# Patient Record
Sex: Male | Born: 2000 | Race: Black or African American | Hispanic: No | Marital: Single | Smoking: Current some day smoker
Health system: Southern US, Community
[De-identification: ages and names within clinical notes are randomized; demographics above are authoritative.]

## PROBLEM LIST (undated history)

## (undated) ENCOUNTER — Ambulatory Visit: Payer: Self-pay | Source: Home / Self Care

## (undated) DIAGNOSIS — F909 Attention-deficit hyperactivity disorder, unspecified type: Secondary | ICD-10-CM

---
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
## 2020-08-04 ENCOUNTER — Other Ambulatory Visit: Payer: Self-pay

## 2020-08-04 ENCOUNTER — Ambulatory Visit (HOSPITAL_COMMUNITY)
Admission: EM | Admit: 2020-08-04 | Discharge: 2020-08-04 | Discharge status: Against Medical Advice | Payer: Medicaid Other

## 2021-06-13 ENCOUNTER — Encounter (HOSPITAL_COMMUNITY): Payer: Self-pay | Admitting: Emergency Medicine

## 2021-06-13 ENCOUNTER — Emergency Department (HOSPITAL_COMMUNITY)
Admission: EM | Admit: 2021-06-13 | Discharge: 2021-06-14 | Disposition: A | Discharge status: Home / Self Care | Payer: Medicaid Other | Attending: Emergency Medicine | Admitting: Emergency Medicine

## 2021-06-13 ENCOUNTER — Emergency Department (HOSPITAL_COMMUNITY): Payer: Medicaid Other

## 2021-06-13 DIAGNOSIS — M25561 Pain in right knee: Secondary | ICD-10-CM | POA: Diagnosis present

## 2021-06-13 DIAGNOSIS — Z23 Encounter for immunization: Secondary | ICD-10-CM | POA: Diagnosis not present

## 2021-06-13 DIAGNOSIS — X58XXXA Exposure to other specified factors, initial encounter: Secondary | ICD-10-CM | POA: Insufficient documentation

## 2021-06-13 DIAGNOSIS — S80251A Superficial foreign body, right knee, initial encounter: Secondary | ICD-10-CM | POA: Insufficient documentation

## 2021-06-13 DIAGNOSIS — Z5321 Procedure and treatment not carried out due to patient leaving prior to being seen by health care provider: Secondary | ICD-10-CM

## 2021-06-13 MED ORDER — TETANUS-DIPHTH-ACELL PERTUSSIS 5-2.5-18.5 LF-MCG/0.5 IM SUSY: 0.5000 mL | PREFILLED_SYRINGE | Freq: Once | INTRAMUSCULAR | Status: DC
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
## 2021-06-14 ENCOUNTER — Emergency Department (HOSPITAL_COMMUNITY)
Admission: EM | Admit: 2021-06-14 | Discharge: 2021-06-14 | Disposition: A | Discharge status: Home / Self Care | Payer: Medicaid Other | Source: Home / Self Care | Attending: Emergency Medicine | Admitting: Emergency Medicine

## 2021-06-14 ENCOUNTER — Other Ambulatory Visit: Payer: Self-pay

## 2021-06-14 ENCOUNTER — Encounter (HOSPITAL_COMMUNITY): Payer: Self-pay | Admitting: Emergency Medicine

## 2021-06-14 DIAGNOSIS — Z23 Encounter for immunization: Secondary | ICD-10-CM | POA: Insufficient documentation

## 2021-06-14 DIAGNOSIS — S80251A Superficial foreign body, right knee, initial encounter: Secondary | ICD-10-CM | POA: Insufficient documentation

## 2021-06-14 DIAGNOSIS — X58XXXA Exposure to other specified factors, initial encounter: Secondary | ICD-10-CM | POA: Insufficient documentation

## 2021-06-14 DIAGNOSIS — S80259A Superficial foreign body, unspecified knee, initial encounter: Secondary | ICD-10-CM

## 2021-06-14 MED ORDER — TETANUS-DIPHTH-ACELL PERTUSSIS 5-2.5-18.5 LF-MCG/0.5 IM SUSY
0.5000 mL | PREFILLED_SYRINGE | Freq: Once | INTRAMUSCULAR | Status: AC
  Administered 2021-06-14: 0.5 mL via INTRAMUSCULAR
  Filled 2021-06-14: qty 0.5
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
## 2021-06-14 NOTE — ED Notes (Signed)
Pt d/c home per MD order. Discharge summary reviewed with pt, pt verbalizes understanding. Ambulatory off unit. No s/s of acute distress noted. Discharged home with visitor.  
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
## 2021-06-14 NOTE — Discharge Instructions (Signed)
Call on Monday to schedule follow-up appointment with the orthopedic surgeon as soon as possible.  Return to the emergency room if you have any worsening symptoms, including worsening pain, swelling, redness, fever or any other worsening symptoms.
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
## 2021-06-17 ENCOUNTER — Other Ambulatory Visit: Payer: Self-pay | Admitting: Orthopaedic Surgery

## 2021-06-17 ENCOUNTER — Other Ambulatory Visit (HOSPITAL_COMMUNITY): Payer: Self-pay | Admitting: Orthopaedic Surgery

## 2021-06-17 DIAGNOSIS — S80251A Superficial foreign body, right knee, initial encounter: Secondary | ICD-10-CM

## 2021-06-26 ENCOUNTER — Other Ambulatory Visit: Payer: Self-pay

## 2021-06-26 ENCOUNTER — Ambulatory Visit (HOSPITAL_COMMUNITY)
Admission: RE | Admit: 2021-06-26 | Discharge: 2021-06-26 | Disposition: A | Discharge status: Home / Self Care | Payer: Medicaid Other | Source: Ambulatory Visit | Attending: Orthopaedic Surgery | Admitting: Orthopaedic Surgery

## 2021-06-26 DIAGNOSIS — S80251A Superficial foreign body, right knee, initial encounter: Secondary | ICD-10-CM | POA: Insufficient documentation

## 2021-06-30 ENCOUNTER — Other Ambulatory Visit: Payer: Self-pay

## 2021-06-30 ENCOUNTER — Encounter (HOSPITAL_BASED_OUTPATIENT_CLINIC_OR_DEPARTMENT_OTHER): Payer: Self-pay | Admitting: Orthopaedic Surgery

## 2021-06-30 DIAGNOSIS — M795 Residual foreign body in soft tissue: Secondary | ICD-10-CM

## 2021-06-30 HISTORY — DX: Residual foreign body in soft tissue: M79.5
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
## 2021-07-02 ENCOUNTER — Other Ambulatory Visit: Payer: Self-pay

## 2021-07-02 ENCOUNTER — Ambulatory Visit (HOSPITAL_BASED_OUTPATIENT_CLINIC_OR_DEPARTMENT_OTHER)
Admission: RE | Admit: 2021-07-02 | Discharge: 2021-07-02 | Disposition: A | Discharge status: Home / Self Care | Payer: Medicaid Other | Attending: Orthopaedic Surgery | Admitting: Orthopaedic Surgery

## 2021-07-02 ENCOUNTER — Encounter (HOSPITAL_BASED_OUTPATIENT_CLINIC_OR_DEPARTMENT_OTHER)
Admission: RE | Disposition: A | Discharge status: Home / Self Care | Payer: Self-pay | Source: Home / Self Care | Attending: Orthopaedic Surgery

## 2021-07-02 ENCOUNTER — Ambulatory Visit (HOSPITAL_BASED_OUTPATIENT_CLINIC_OR_DEPARTMENT_OTHER): Payer: Medicaid Other | Admitting: Certified Registered"

## 2021-07-02 ENCOUNTER — Encounter (HOSPITAL_BASED_OUTPATIENT_CLINIC_OR_DEPARTMENT_OTHER): Payer: Self-pay | Admitting: Orthopaedic Surgery

## 2021-07-02 DIAGNOSIS — M795 Residual foreign body in soft tissue: Secondary | ICD-10-CM | POA: Diagnosis not present

## 2021-07-02 DIAGNOSIS — F17200 Nicotine dependence, unspecified, uncomplicated: Secondary | ICD-10-CM | POA: Diagnosis not present

## 2021-07-02 HISTORY — PX: KNEE ARTHROSCOPY: SHX127

## 2021-07-02 SURGERY — ARTHROSCOPY, KNEE
Anesthesia: General | Site: Knee | Laterality: Right

## 2021-07-02 MED ORDER — ONDANSETRON HCL 4 MG PO TABS: 4.0000 mg | ORAL_TABLET | Freq: Three times a day (TID) | ORAL | 0 refills | Status: AC | PRN

## 2021-07-02 MED ORDER — DEXAMETHASONE SODIUM PHOSPHATE 10 MG/ML IJ SOLN
INTRAMUSCULAR | Status: DC | PRN
  Administered 2021-07-02: 4 mg via INTRAVENOUS

## 2021-07-02 MED ORDER — OXYCODONE HCL 5 MG PO TABS: ORAL_TABLET | ORAL | 0 refills | Status: AC

## 2021-07-02 MED ORDER — FENTANYL CITRATE (PF) 100 MCG/2ML IJ SOLN
INTRAMUSCULAR | Status: AC
  Filled 2021-07-02: qty 2

## 2021-07-02 MED ORDER — ONDANSETRON HCL 4 MG/2ML IJ SOLN
INTRAMUSCULAR | Status: DC | PRN
  Administered 2021-07-02: 4 mg via INTRAVENOUS

## 2021-07-02 MED ORDER — DEXMEDETOMIDINE (PRECEDEX) IN NS 20 MCG/5ML (4 MCG/ML) IV SYRINGE
PREFILLED_SYRINGE | INTRAVENOUS | Status: DC | PRN
  Administered 2021-07-02: 16 ug via INTRAVENOUS

## 2021-07-02 MED ORDER — MIDAZOLAM HCL 5 MG/5ML IJ SOLN
INTRAMUSCULAR | Status: DC | PRN
  Administered 2021-07-02: 2 mg via INTRAVENOUS

## 2021-07-02 MED ORDER — MIDAZOLAM HCL 2 MG/2ML IJ SOLN
INTRAMUSCULAR | Status: AC
  Filled 2021-07-02: qty 2

## 2021-07-02 MED ORDER — LACTATED RINGERS IV SOLN: INTRAVENOUS | Status: DC

## 2021-07-02 MED ORDER — MELOXICAM 15 MG PO TABS: 15.0000 mg | ORAL_TABLET | Freq: Every day | ORAL | 0 refills | Status: AC

## 2021-07-02 MED ORDER — LIDOCAINE HCL (CARDIAC) PF 100 MG/5ML IV SOSY
PREFILLED_SYRINGE | INTRAVENOUS | Status: DC | PRN
  Administered 2021-07-02: 60 mg via INTRAVENOUS

## 2021-07-02 MED ORDER — BUPIVACAINE HCL (PF) 0.5 % IJ SOLN
INTRAMUSCULAR | Status: AC
  Filled 2021-07-02: qty 30

## 2021-07-02 MED ORDER — DEXMEDETOMIDINE (PRECEDEX) IN NS 20 MCG/5ML (4 MCG/ML) IV SYRINGE
PREFILLED_SYRINGE | INTRAVENOUS | Status: AC
  Filled 2021-07-02: qty 15

## 2021-07-02 MED ORDER — PHENYLEPHRINE 40 MCG/ML (10ML) SYRINGE FOR IV PUSH (FOR BLOOD PRESSURE SUPPORT)
PREFILLED_SYRINGE | INTRAVENOUS | Status: AC
  Filled 2021-07-02: qty 10

## 2021-07-02 MED ORDER — OXYCODONE HCL 5 MG PO TABS: 5.0000 mg | ORAL_TABLET | Freq: Once | ORAL | Status: DC | PRN

## 2021-07-02 MED ORDER — SODIUM CHLORIDE 0.9 % IR SOLN
Status: DC | PRN
  Administered 2021-07-02: 3000 mL

## 2021-07-02 MED ORDER — ROCURONIUM BROMIDE 10 MG/ML (PF) SYRINGE
PREFILLED_SYRINGE | INTRAVENOUS | Status: AC
  Filled 2021-07-02: qty 20

## 2021-07-02 MED ORDER — PROPOFOL 500 MG/50ML IV EMUL
INTRAVENOUS | Status: DC | PRN
  Administered 2021-07-02: 25 ug/kg/min via INTRAVENOUS

## 2021-07-02 MED ORDER — AMISULPRIDE (ANTIEMETIC) 5 MG/2ML IV SOLN: 10.0000 mg | Freq: Once | INTRAVENOUS | Status: DC | PRN

## 2021-07-02 MED ORDER — DEXAMETHASONE SODIUM PHOSPHATE 10 MG/ML IJ SOLN
INTRAMUSCULAR | Status: AC
  Filled 2021-07-02: qty 3

## 2021-07-02 MED ORDER — FENTANYL CITRATE (PF) 100 MCG/2ML IJ SOLN
INTRAMUSCULAR | Status: DC | PRN
  Administered 2021-07-02: 25 ug via INTRAVENOUS
  Administered 2021-07-02 (×2): 50 ug via INTRAVENOUS

## 2021-07-02 MED ORDER — PROMETHAZINE HCL 25 MG/ML IJ SOLN: 6.2500 mg | INTRAMUSCULAR | Status: DC | PRN

## 2021-07-02 MED ORDER — SUGAMMADEX SODIUM 500 MG/5ML IV SOLN
INTRAVENOUS | Status: AC
  Filled 2021-07-02: qty 5

## 2021-07-02 MED ORDER — OXYCODONE HCL 5 MG/5ML PO SOLN: 5.0000 mg | Freq: Once | ORAL | Status: DC | PRN

## 2021-07-02 MED ORDER — ACETAMINOPHEN 500 MG PO TABS: 1000.0000 mg | ORAL_TABLET | Freq: Three times a day (TID) | ORAL | 0 refills | Status: AC

## 2021-07-02 MED ORDER — MEPERIDINE HCL 25 MG/ML IJ SOLN: 6.2500 mg | INTRAMUSCULAR | Status: DC | PRN

## 2021-07-02 MED ORDER — ONDANSETRON HCL 4 MG/2ML IJ SOLN
INTRAMUSCULAR | Status: AC
  Filled 2021-07-02: qty 14

## 2021-07-02 MED ORDER — CEFAZOLIN SODIUM-DEXTROSE 2-4 GM/100ML-% IV SOLN
INTRAVENOUS | Status: AC
  Filled 2021-07-02: qty 100

## 2021-07-02 MED ORDER — CEFAZOLIN SODIUM-DEXTROSE 2-4 GM/100ML-% IV SOLN
2.0000 g | INTRAVENOUS | Status: AC
  Administered 2021-07-02: 2 g via INTRAVENOUS

## 2021-07-02 MED ORDER — ASPIRIN 81 MG PO CHEW: 81.0000 mg | CHEWABLE_TABLET | Freq: Two times a day (BID) | ORAL | 0 refills | Status: AC

## 2021-07-02 MED ORDER — BUPIVACAINE HCL 0.5 % IJ SOLN
INTRAMUSCULAR | Status: DC | PRN
  Administered 2021-07-02: 20 mL

## 2021-07-02 MED ORDER — PROPOFOL 10 MG/ML IV BOLUS
INTRAVENOUS | Status: DC | PRN
  Administered 2021-07-02: 200 mg via INTRAVENOUS

## 2021-07-02 MED ORDER — HYDROMORPHONE HCL 1 MG/ML IJ SOLN: 0.2500 mg | INTRAMUSCULAR | Status: DC | PRN

## 2021-07-02 SURGICAL SUPPLY — 37 items
BANDAGE ESMARK 6X9 LF (GAUZE/BANDAGES/DRESSINGS) IMPLANT
BLADE CLIPPER SURG (BLADE) IMPLANT
BNDG ELASTIC 6X5.8 VLCR STR LF (GAUZE/BANDAGES/DRESSINGS) ×3 IMPLANT
BNDG ESMARK 6X9 LF (GAUZE/BANDAGES/DRESSINGS)
CHLORAPREP W/TINT 26 (MISCELLANEOUS) ×3 IMPLANT
CLOSURE STERI-STRIP 1/2X4 (GAUZE/BANDAGES/DRESSINGS) ×1
CLSR STERI-STRIP ANTIMIC 1/2X4 (GAUZE/BANDAGES/DRESSINGS) ×2 IMPLANT
CUFF TOURN SGL QUICK 34 (TOURNIQUET CUFF) ×2
CUFF TRNQT CYL 34X4.125X (TOURNIQUET CUFF) ×1 IMPLANT
DISSECTOR 3.5MM X 13CM CVD (MISCELLANEOUS) ×3 IMPLANT
DISSECTOR 4.0MMX13CM CVD (MISCELLANEOUS) IMPLANT
DRAPE ARTHROSCOPY W/POUCH 90 (DRAPES) ×3 IMPLANT
DRAPE IMP U-DRAPE 54X76 (DRAPES) ×3 IMPLANT
DRAPE U-SHAPE 47X51 STRL (DRAPES) ×3 IMPLANT
GAUZE SPONGE 4X4 12PLY STRL (GAUZE/BANDAGES/DRESSINGS) ×3 IMPLANT
GAUZE XEROFORM 1X8 LF (GAUZE/BANDAGES/DRESSINGS) ×3 IMPLANT
GLOVE SRG 8 PF TXTR STRL LF DI (GLOVE) ×1 IMPLANT
GLOVE SURG ENC MOIS LTX SZ6.5 (GLOVE) ×9 IMPLANT
GLOVE SURG LTX SZ8 (GLOVE) ×6 IMPLANT
GLOVE SURG UNDER POLY LF SZ6.5 (GLOVE) ×3 IMPLANT
GLOVE SURG UNDER POLY LF SZ8 (GLOVE) ×2
GOWN STRL REUS W/ TWL LRG LVL3 (GOWN DISPOSABLE) ×2 IMPLANT
GOWN STRL REUS W/TWL LRG LVL3 (GOWN DISPOSABLE) ×4
GOWN STRL REUS W/TWL XL LVL3 (GOWN DISPOSABLE) ×3 IMPLANT
KIT TURNOVER KIT B (KITS) ×3 IMPLANT
MANIFOLD NEPTUNE II (INSTRUMENTS) ×3 IMPLANT
NDL SAFETY ECLIPSE 18X1.5 (NEEDLE) ×1 IMPLANT
NEEDLE HYPO 18GX1.5 SHARP (NEEDLE) ×2
NS IRRIG 1000ML POUR BTL (IV SOLUTION) IMPLANT
PACK ARTHROSCOPY DSU (CUSTOM PROCEDURE TRAY) ×3 IMPLANT
PAD ARMBOARD 7.5X6 YLW CONV (MISCELLANEOUS) ×6 IMPLANT
PADDING CAST COTTON 6X4 STRL (CAST SUPPLIES) IMPLANT
SUT MNCRL AB 4-0 PS2 18 (SUTURE) ×3 IMPLANT
SYR 5ML LUER SLIP (SYRINGE) ×3 IMPLANT
TOWEL GREEN STERILE FF (TOWEL DISPOSABLE) ×3 IMPLANT
TUBING ARTHROSCOPY IRRIG 16FT (MISCELLANEOUS) ×3 IMPLANT
WATER STERILE IRR 1000ML POUR (IV SOLUTION) ×3 IMPLANT
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
## 2021-07-02 NOTE — Interval H&P Note (Signed)
All questions answered, patient wants to proceed with procedure. ? ?

## 2021-07-02 NOTE — Anesthesia Procedure Notes (Signed)
Procedure Name: LMA Insertion Date/Time: 07/02/2021 11:46 AM Performed by: Sheryn Bison, CRNA Pre-anesthesia Checklist: Patient identified, Emergency Drugs available, Suction available and Patient being monitored Patient Re-evaluated:Patient Re-evaluated prior to induction Oxygen Delivery Method: Circle System Utilized Preoxygenation: Pre-oxygenation with 100% oxygen Induction Type: IV induction Ventilation: Mask ventilation without difficulty LMA: LMA inserted LMA Size: 4.0 Number of attempts: 1 Airway Equipment and Method: bite block Placement Confirmation: positive ETCO2 Tube secured with: Tape Dental Injury: Teeth and Oropharynx as per pre-operative assessment

## 2021-07-02 NOTE — Anesthesia Preprocedure Evaluation (Signed)
Anesthesia Evaluation  Patient identified by MRN, date of birth, ID band Patient awake    Reviewed: Allergy & Precautions, H&P , NPO status , Patient's Chart, lab work & pertinent test results  Airway Mallampati: II  TM Distance: >3 FB Neck ROM: Full    Dental no notable dental hx.    Pulmonary neg pulmonary ROS, Current Smoker and Patient abstained from smoking.,    Pulmonary exam normal breath sounds clear to auscultation       Cardiovascular negative cardio ROS Normal cardiovascular exam Rhythm:Regular Rate:Normal     Neuro/Psych negative neurological ROS  negative psych ROS   GI/Hepatic negative GI ROS, Neg liver ROS,   Endo/Other  negative endocrine ROS  Renal/GU negative Renal ROS  negative genitourinary   Musculoskeletal negative musculoskeletal ROS (+)   Abdominal   Peds negative pediatric ROS (+)  Hematology negative hematology ROS (+)   Anesthesia Other Findings   Reproductive/Obstetrics negative OB ROS                             Anesthesia Physical Anesthesia Plan  ASA: 2  Anesthesia Plan: General   Post-op Pain Management:    Induction: Intravenous  PONV Risk Score and Plan: 1 and Ondansetron and Treatment may vary due to age or medical condition  Airway Management Planned: LMA  Additional Equipment:   Intra-op Plan:   Post-operative Plan: Extubation in OR  Informed Consent: I have reviewed the patients History and Physical, chart, labs and discussed the procedure including the risks, benefits and alternatives for the proposed anesthesia with the patient or authorized representative who has indicated his/her understanding and acceptance.     Dental advisory given  Plan Discussed with: CRNA  Anesthesia Plan Comments:         Anesthesia Quick Evaluation

## 2021-07-02 NOTE — Discharge Instructions (Addendum)
Ramond Marrow MD, MPH Dan Alpers, PA-C [facility] [address], [address] [phone] (tel)   [phone] (fax)   POST-OPERATIVE INSTRUCTIONS - Knee Arthroscopy  WOUND CARE - You may remove the Operative Dressing on Post-Op Day #3 (72hrs after surgery).   -  Alternatively if you would like you can leave dressing on until follow-up if within 7-8 days but keep it dry. - Leave steri-strips in place until they fall off on their own, usually 2 weeks postop. - An ACE wrap may be used to control swelling, do not wrap this too tight.  If the initial ACE wrap feels too tight you may loosen it. - There may be a small amount of fluid/bleeding leaking at the surgical site.  - This is normal; the knee is filled with fluid during the procedure and can leak for 24-48hrs after surgery. You may change/reinforce the bandage as needed.  - Use the Cryocuff or Ice as often as possible for the first 7 days, then as needed for pain relief. Always keep a towel, ACE wrap or other barrier between the cooling unit and your skin.  - You may shower on Post-Op Day #3. Gently pat the area dry. Do not soak the knee in water or submerge it.  - Do not go swimming in the pool or ocean until 4 weeks after surgery or when otherwise instructed.  Keep incisions as dry as possible.   BRACE/AMBULATION -  You will not need a brace after this procedure.   - You can use crutches or a walker initially to help you ambulate but this is not required - You can put full weight on your operative leg as you feel comfortable  REGIONAL ANESTHESIA (NERVE BLOCKS) - The anesthesia team may have performed a nerve block for you if safe in the setting of your care.  This is a great tool used to minimize pain.  Typically the block may start wearing off overnight.  This can be a challenging period but please utilize your as needed pain medications to try and manage this period and know it will be a brief transition as the  nerve block wears completely   POST-OP MEDICATIONS - Multimodal approach to pain control - In general your pain will be controlled with a combination of substances.  Prescriptions unless otherwise discussed are electronically sent to your pharmacy.  This is a carefully made plan we use to minimize narcotic use.     - Meloxicam - Anti-inflammatory medication taken on a scheduled basis - Acetaminophen - Non-narcotic pain medicine taken on a scheduled basis  - Oxycodone - This is a strong narcotic, to be used only on an "as needed" basis for SEVERE pain. - Aspirin 81mg  - This medicine is used to minimize the risk of blood clots after surgery. -  Zofran - take as needed for nausea  FOLLOW-UP   Please call the office to schedule a follow-up appointment for your incision check, 7-10 days post-operatively.   IF YOU HAVE ANY QUESTIONS, PLEASE FEEL FREE TO CALL OUR OFFICE.   HELPFUL INFORMATION  - If you had a block, it will wear off between 8-24 hrs postop typically.  This is period when your pain may go from nearly zero to the pain you would have had post-op without the block.  This is an abrupt transition but nothing dangerous is happening.  You may take an extra dose of narcotic when this happens.   Keep your leg elevated to  decrease swelling, which will then in turn decrease your pain. I would elevate the foot of your bed by putting a couple of couch pillows between your mattress and box spring. I would not keep pillow directly under your ankle.  - Do not sleep with a pillow behind your knee even if it is more comfortable as this may make it harder to get your knee fully straight long term.   There will be MORE swelling on days 1-3 than there is on the day of surgery.  This also is normal. The swelling will decrease with the anti-inflammatory medication, ice and keeping it elevated. The swelling will make it more difficult to bend your knee. As the swelling goes down your motion will become  easier   You may develop swelling and bruising that extends from your knee down to your calf and perhaps even to your foot over the next week. Do not be alarmed. This too is normal, and it is due to gravity   There may be some numbness adjacent to the incision site. This may last for 6-12 months or longer in some patients and is expected.   You may return to sedentary work/school in the next couple of days when you feel up to it. You will need to keep your leg elevated as much as possible    You should wean off your narcotic medicines as soon as you are able.  You should be off of narcotics by your first postop appointment.    We suggest you use the pain medication the first night prior to going to bed, in order to ease any pain when the anesthesia wears off. You should avoid taking pain medications on an empty stomach as it will make you nauseous.   Do not drink alcoholic beverages or take illicit drugs when taking pain medications.   It is against the law to drive while taking narcotics. You cannot drive if your Right leg is in brace locked in extension.   Pain medication may make you constipated.  Below are a few solutions to try in this order:  o Decrease the amount of pain medication if you aren't having pain.  o Drink lots of decaffeinated fluids.  o Drink prune juice and/or eat dried prunes   o If the first 3 don't work start with additional solutions  o Take Colace - an over-the-counter stool softener  o Take Senokot - an over-the-counter laxative  o Take Miralax - a stronger over-the-counter laxative    For more information including helpful videos and documents visit our website:   [email or web address]        Post Anesthesia Home Care Instructions  Activity: Get plenty of rest for the remainder of the day. A responsible individual must stay with you for 24 hours following the procedure.  For the next 24 hours, DO NOT: -Drive  a car -Advertising copywriter -Drink alcoholic beverages -Take any medication unless instructed by your physician -Make any legal decisions or sign important papers.  Meals: Start with liquid foods such as gelatin or soup. Progress to regular foods as tolerated. Avoid greasy, spicy, heavy foods. If nausea and/or vomiting occur, drink only clear liquids until the nausea and/or vomiting subsides. Call your physician if vomiting continues.  Special Instructions/Symptoms: Your throat may feel dry or sore from the anesthesia or the breathing tube placed in your throat during surgery. If this causes discomfort, gargle with warm salt water. The discomfort should disappear within 24 hours.  If you  had a scopolamine patch placed behind your ear for the management of post- operative nausea and/or vomiting:  1. The medication in the patch is effective for 72 hours, after which it should be removed.  Wrap patch in a tissue and discard in the trash. Wash hands thoroughly with soap and water. 2. You may remove the patch earlier than 72 hours if you experience unpleasant side effects which may include dry mouth, dizziness or visual disturbances. 3. Avoid touching the patch. Wash your hands with soap and water after contact with the patch.
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
## 2021-07-02 NOTE — Transfer of Care (Signed)
Immediate Anesthesia Transfer of Care Note  Patient: Dan Mata  Procedure(s) Performed: ARTHROSCOPY KNEE WITH REMOVAL OF FOREIGN BODY (Right)  Patient Location: PACU  Anesthesia Type:General  Level of Consciousness: drowsy and patient cooperative  Airway & Oxygen Therapy: Patient Spontanous Breathing and Patient connected to face mask oxygen  Post-op Assessment: Report given to RN and Post -op Vital signs reviewed and stable  Post vital signs: Reviewed and stable  Last Vitals:  Vitals Value Taken Time  BP    Temp    Pulse 74 07/02/21 1221  Resp    SpO2 99 % 07/02/21 1221  Vitals shown include unvalidated device data.  Last Pain:  Vitals:   07/02/21 1033  TempSrc: Oral  PainSc: 0-No pain         Complications: No notable events documented.

## 2021-07-04 NOTE — Anesthesia Postprocedure Evaluation (Signed)
Anesthesia Post Note  Patient: Dan Mata  Procedure(s) Performed: ARTHROSCOPY KNEE WITH REMOVAL OF FOREIGN BODY (Right: Knee)     Patient location during evaluation: PACU Anesthesia Type: General Level of consciousness: awake and alert Pain management: pain level controlled Vital Signs Assessment: post-procedure vital signs reviewed and stable Respiratory status: spontaneous breathing, nonlabored ventilation and respiratory function stable Cardiovascular status: blood pressure returned to baseline and stable Postop Assessment: no apparent nausea or vomiting Anesthetic complications: no   No notable events documented.  Last Vitals:  Vitals:   07/02/21 1245 07/02/21 1315  BP: 124/87 (!) 126/98  Pulse: 75 76  Resp: (!) 25 16  Temp:  36.4 C  SpO2: 99% 100%    Last Pain:  Vitals:   07/02/21 1302  TempSrc:   PainSc: 0-No pain   Pain Goal:                   Lowella Curb

## 2021-07-07 ENCOUNTER — Encounter (HOSPITAL_BASED_OUTPATIENT_CLINIC_OR_DEPARTMENT_OTHER): Payer: Self-pay | Admitting: Orthopaedic Surgery
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
## 2023-11-10 ENCOUNTER — Encounter (HOSPITAL_COMMUNITY): Payer: Self-pay

## 2023-11-10 ENCOUNTER — Ambulatory Visit (HOSPITAL_COMMUNITY)
Admission: EM | Admit: 2023-11-10 | Discharge: 2023-11-10 | Disposition: A | Discharge status: Home / Self Care | Payer: Self-pay

## 2023-11-10 DIAGNOSIS — J309 Allergic rhinitis, unspecified: Secondary | ICD-10-CM

## 2023-11-10 DIAGNOSIS — H1013 Acute atopic conjunctivitis, bilateral: Secondary | ICD-10-CM

## 2023-11-10 MED ORDER — CETIRIZINE HCL 10 MG PO TABS: 10.0000 mg | ORAL_TABLET | Freq: Every day | ORAL | 1 refills | Status: AC

## 2023-11-10 MED ORDER — AZELASTINE HCL 0.05 % OP SOLN: 1.0000 [drp] | Freq: Two times a day (BID) | OPHTHALMIC | 2 refills | Status: AC

## 2023-11-10 NOTE — Discharge Instructions (Addendum)
 1. Allergic conjunctivitis of both eyes and rhinitis (Primary) - cetirizine (ZYRTEC) 10 MG tablet; Take 1 tablet (10 mg total) by mouth daily.  Dispense: 30 tablet; Refill: 1 - azelastine (OPTIVAR) 0.05 % ophthalmic solution; Place 1 drop into both eyes 2 (two) times daily.  Dispense: 6 mL; Refill: 2

## 2023-11-10 NOTE — ED Triage Notes (Signed)
 Patient presented to the office for bilateral eye redness and pain. Patient reports nasal congestion.
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
